# Patient Record
Sex: Female | Born: 1980 | Race: Black or African American | Hispanic: No | Marital: Married | Smoking: Never smoker
Health system: Southern US, Community
[De-identification: ages and names within clinical notes are randomized; demographics above are authoritative.]

---

## 2020-04-01 ENCOUNTER — Emergency Department (HOSPITAL_COMMUNITY)
Admission: EM | Admit: 2020-04-01 | Discharge: 2020-04-01 | Disposition: A | Discharge status: Home / Self Care | Payer: No Typology Code available for payment source | Attending: Emergency Medicine | Admitting: Emergency Medicine

## 2020-04-01 ENCOUNTER — Encounter (HOSPITAL_COMMUNITY): Payer: Self-pay | Admitting: *Deleted

## 2020-04-01 ENCOUNTER — Other Ambulatory Visit: Payer: Self-pay

## 2020-04-01 DIAGNOSIS — M542 Cervicalgia: Secondary | ICD-10-CM | POA: Insufficient documentation

## 2020-04-01 DIAGNOSIS — Z5321 Procedure and treatment not carried out due to patient leaving prior to being seen by health care provider: Secondary | ICD-10-CM | POA: Insufficient documentation

## 2020-04-01 NOTE — ED Notes (Signed)
Call pt no answer.
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
## 2020-09-16 ENCOUNTER — Other Ambulatory Visit: Payer: Self-pay | Admitting: Pain Medicine

## 2020-09-16 DIAGNOSIS — M5412 Radiculopathy, cervical region: Secondary | ICD-10-CM

## 2020-10-03 ENCOUNTER — Ambulatory Visit
Admission: RE | Admit: 2020-10-03 | Discharge: 2020-10-03 | Disposition: A | Discharge status: Home / Self Care | Payer: No Typology Code available for payment source | Source: Ambulatory Visit | Attending: Pain Medicine | Admitting: Pain Medicine

## 2020-10-03 ENCOUNTER — Other Ambulatory Visit: Payer: Self-pay

## 2020-10-03 DIAGNOSIS — M5412 Radiculopathy, cervical region: Secondary | ICD-10-CM

## 2021-02-01 ENCOUNTER — Other Ambulatory Visit: Payer: Self-pay | Admitting: Gastroenterology

## 2021-02-01 DIAGNOSIS — R1084 Generalized abdominal pain: Secondary | ICD-10-CM

## 2021-02-01 DIAGNOSIS — R1030 Lower abdominal pain, unspecified: Secondary | ICD-10-CM

## 2021-02-15 ENCOUNTER — Other Ambulatory Visit: Payer: Self-pay

## 2022-01-10 ENCOUNTER — Other Ambulatory Visit: Payer: Self-pay | Admitting: Physician Assistant

## 2022-01-10 DIAGNOSIS — Z1231 Encounter for screening mammogram for malignant neoplasm of breast: Secondary | ICD-10-CM

## 2022-01-26 ENCOUNTER — Ambulatory Visit
Admission: RE | Admit: 2022-01-26 | Discharge: 2022-01-26 | Disposition: A | Discharge status: Home / Self Care | Payer: PRIVATE HEALTH INSURANCE | Source: Ambulatory Visit | Attending: Physician Assistant | Admitting: Physician Assistant

## 2022-01-26 DIAGNOSIS — Z1231 Encounter for screening mammogram for malignant neoplasm of breast: Secondary | ICD-10-CM

## 2022-01-27 ENCOUNTER — Other Ambulatory Visit: Payer: Self-pay | Admitting: Physician Assistant

## 2022-01-27 DIAGNOSIS — R928 Other abnormal and inconclusive findings on diagnostic imaging of breast: Secondary | ICD-10-CM

## 2022-02-08 ENCOUNTER — Ambulatory Visit
Admission: RE | Admit: 2022-02-08 | Discharge: 2022-02-08 | Disposition: A | Discharge status: Home / Self Care | Payer: PRIVATE HEALTH INSURANCE | Source: Ambulatory Visit | Attending: Physician Assistant | Admitting: Physician Assistant

## 2022-02-08 ENCOUNTER — Other Ambulatory Visit: Payer: Self-pay | Admitting: Physician Assistant

## 2022-02-08 DIAGNOSIS — R928 Other abnormal and inconclusive findings on diagnostic imaging of breast: Secondary | ICD-10-CM

## 2022-02-08 DIAGNOSIS — N6310 Unspecified lump in the right breast, unspecified quadrant: Secondary | ICD-10-CM

## 2022-02-17 ENCOUNTER — Ambulatory Visit
Admission: RE | Admit: 2022-02-17 | Discharge: 2022-02-17 | Disposition: A | Discharge status: Home / Self Care | Payer: PRIVATE HEALTH INSURANCE | Source: Ambulatory Visit | Attending: Physician Assistant | Admitting: Physician Assistant

## 2022-02-17 ENCOUNTER — Other Ambulatory Visit (HOSPITAL_COMMUNITY): Payer: Self-pay | Admitting: Diagnostic Radiology

## 2022-12-06 ENCOUNTER — Other Ambulatory Visit: Payer: Self-pay | Admitting: Physician Assistant

## 2022-12-06 DIAGNOSIS — Z1231 Encounter for screening mammogram for malignant neoplasm of breast: Secondary | ICD-10-CM

## 2023-03-03 IMAGING — MG MM BREAST LOCALIZATION CLIP
4 series · 4 of 12 positions shown · non-contrast
Comparison: Previous exam(s).

CLINICAL DATA: Status post ultrasound-guided core needle biopsy of
a 1.2 cm mass in the 1 o'clock position of the right breast.

EXAM:
3D DIAGNOSTIC RIGHT MAMMOGRAM POST ULTRASOUND BIOPSY

[R CC synth-2D]
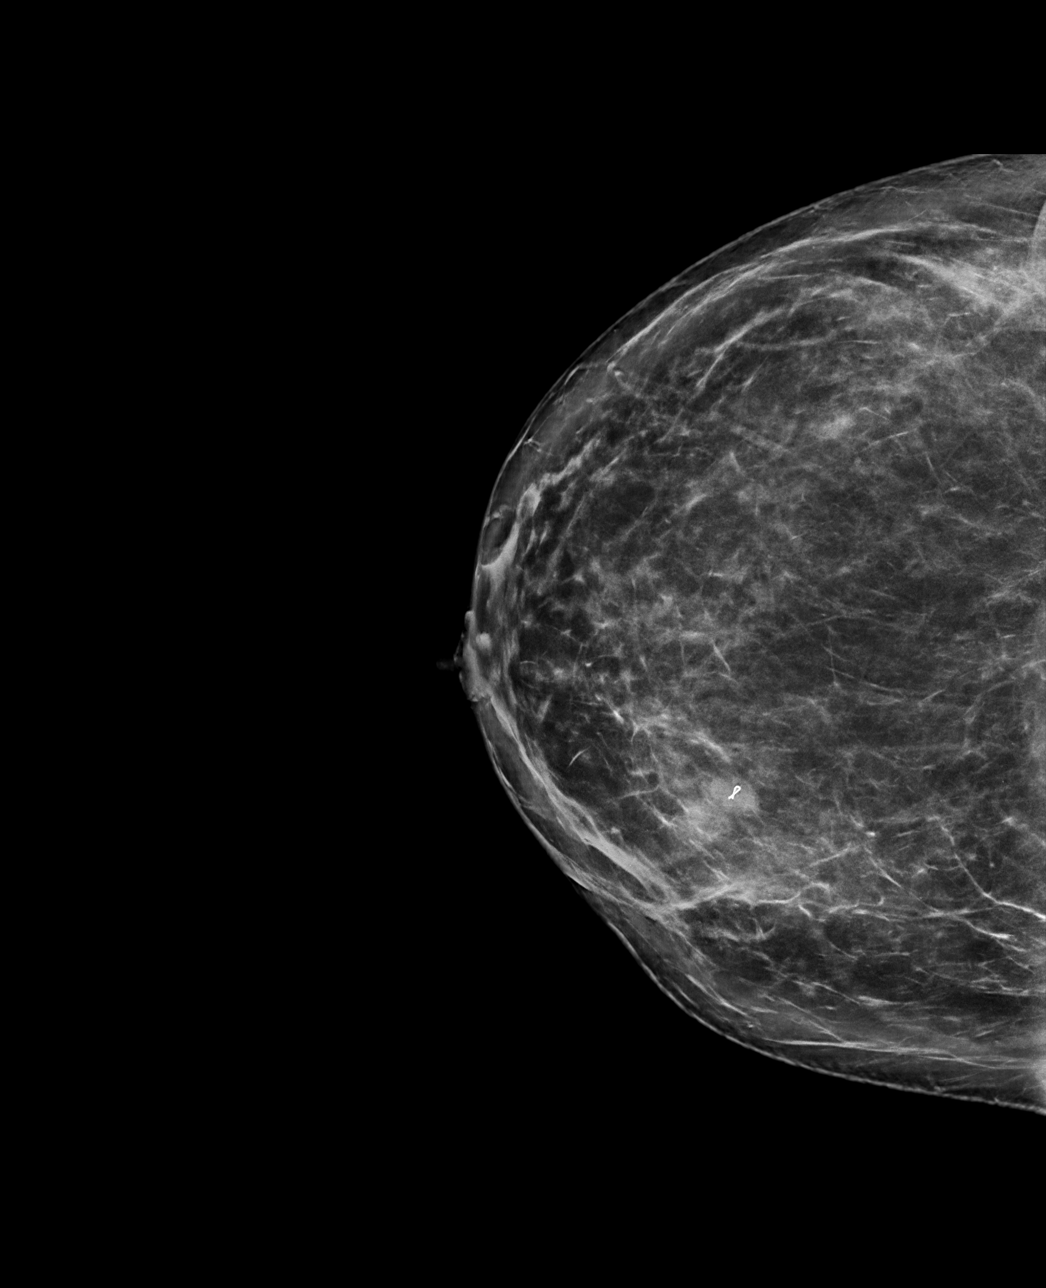

[R ML synth-2D]
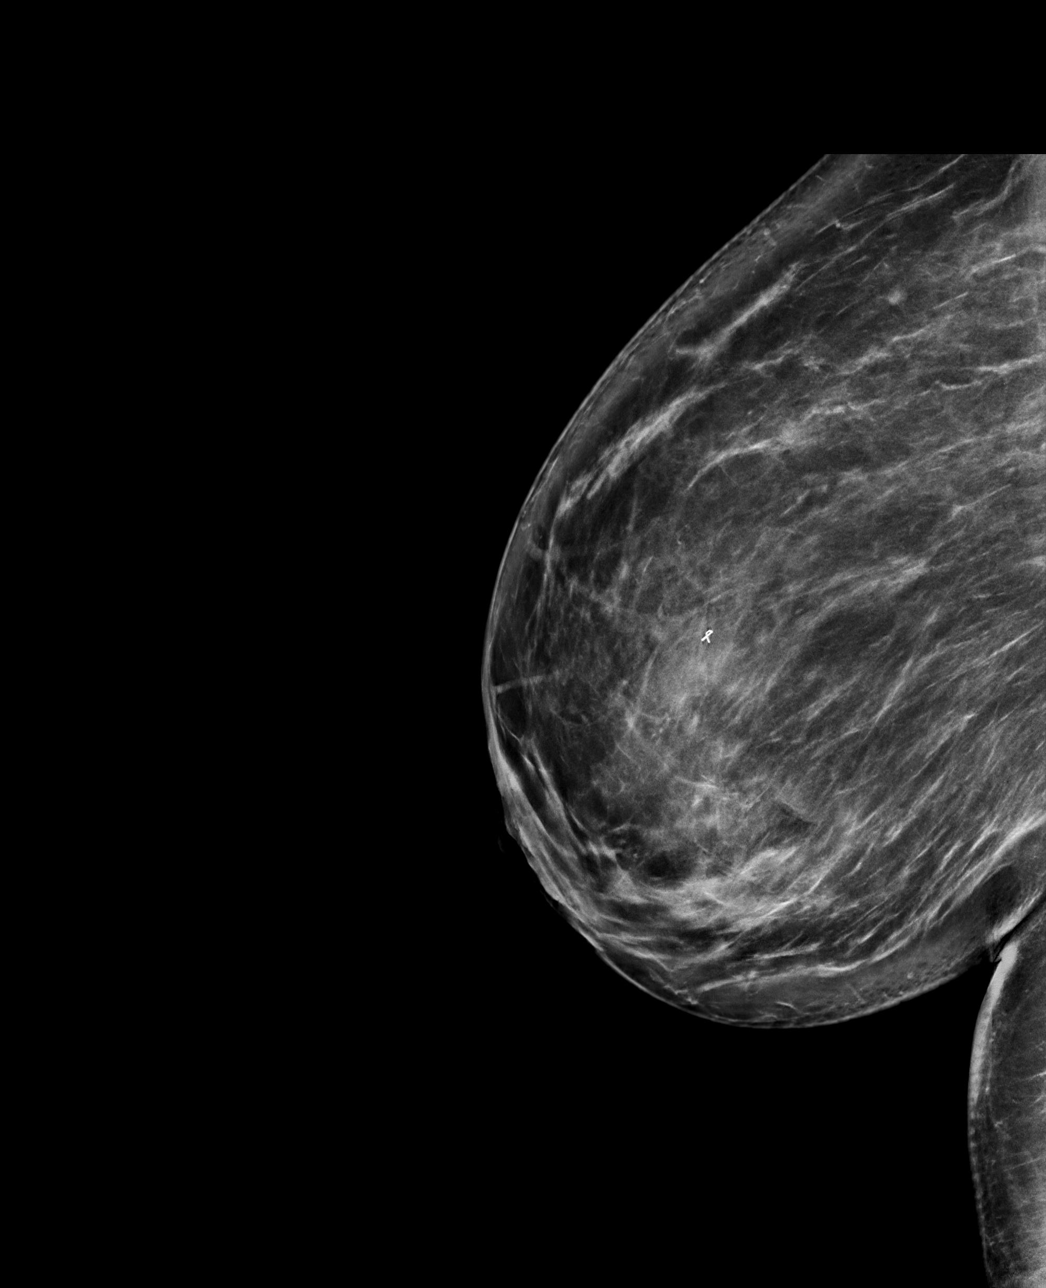

[R CC tomo · tomo slice 52/103.0]
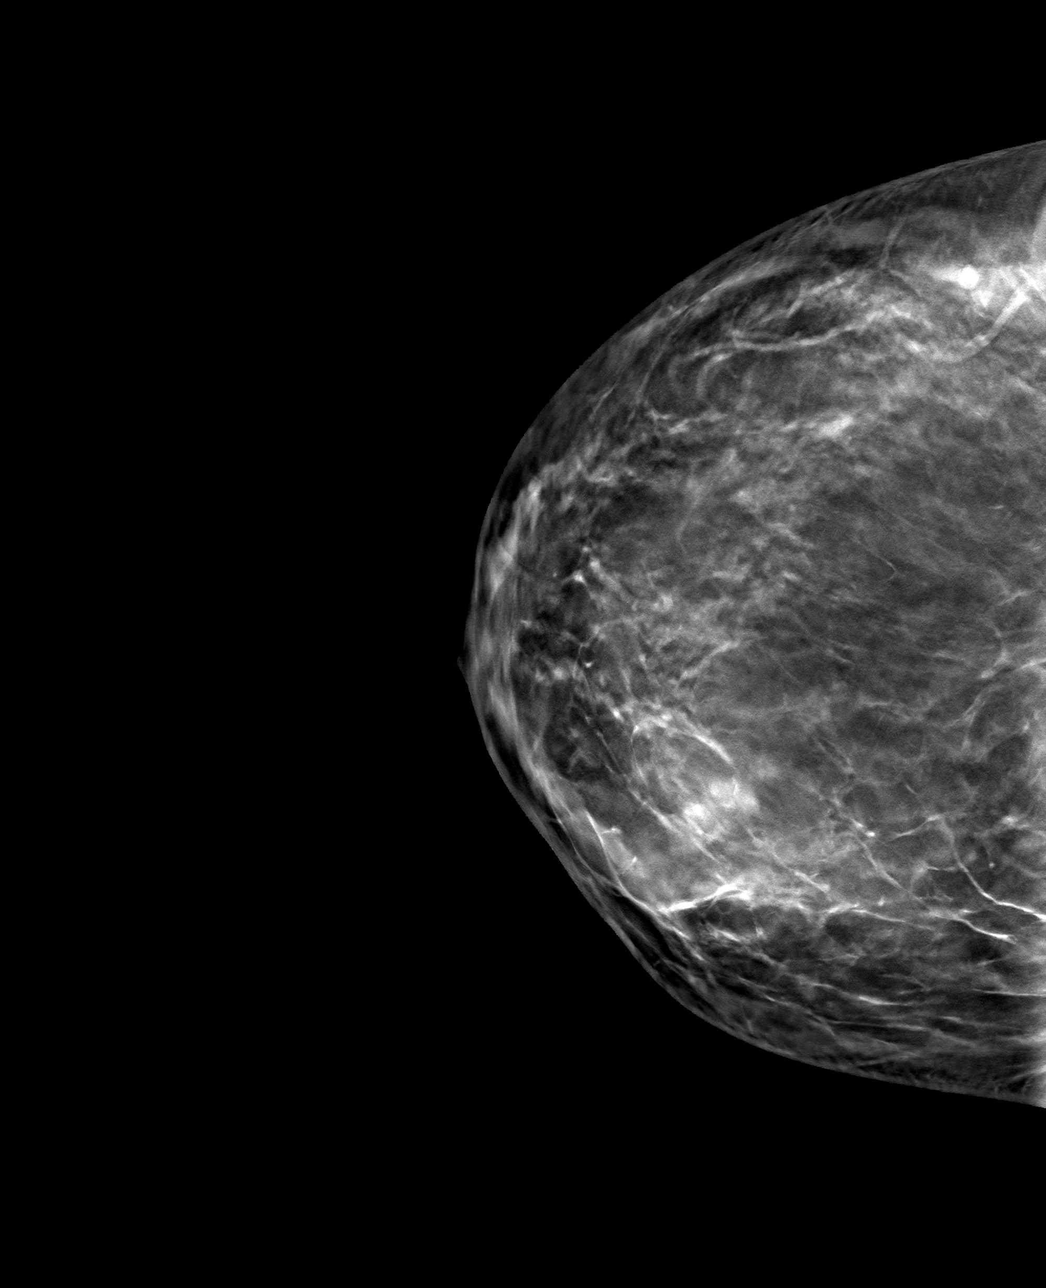

[R ML tomo · tomo slice 55/108.0]
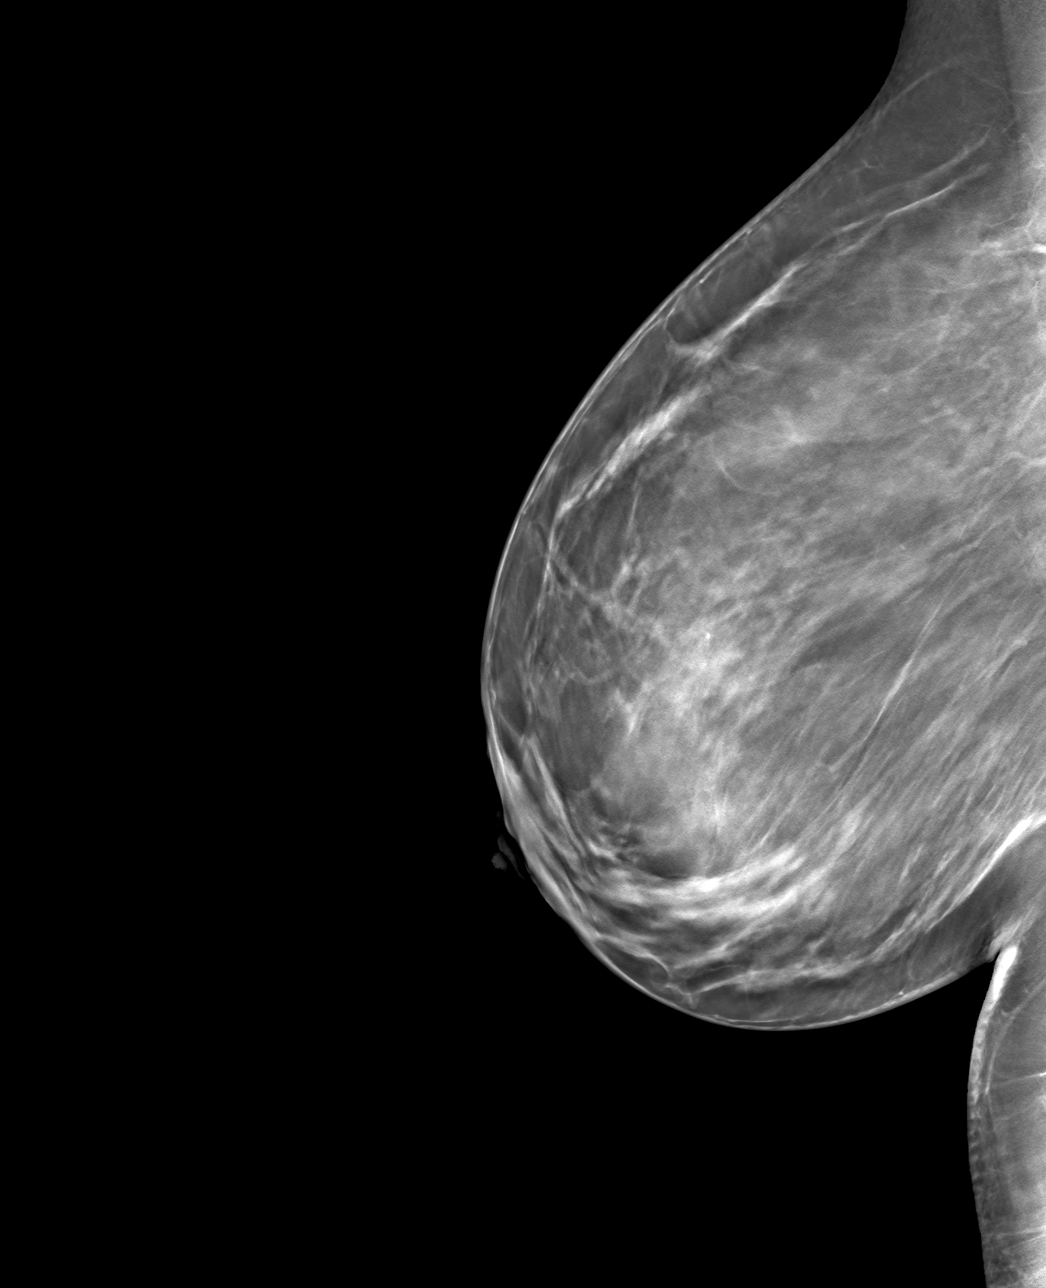

[4 of 12 positions shown; findings below may reference images not displayed]

FINDINGS: 3D Mammographic images were obtained following ultrasound guided
biopsy of a 1.2 cm mass in the 1 o'clock position of the right
breast. The biopsy marking clip is in expected position at the site
of biopsy.
IMPRESSION: Appropriate positioning of the ribbon shaped biopsy marking clip in
the biopsied mass in the 1 o'clock position of the right breast.

Final Assessment: Post Procedure Mammograms for Marker Placement

## 2024-03-29 ENCOUNTER — Other Ambulatory Visit: Payer: Self-pay | Admitting: Physician Assistant

## 2024-03-29 DIAGNOSIS — Z1231 Encounter for screening mammogram for malignant neoplasm of breast: Secondary | ICD-10-CM

## 2024-04-23 ENCOUNTER — Ambulatory Visit: Payer: PRIVATE HEALTH INSURANCE

## 2024-08-12 ENCOUNTER — Other Ambulatory Visit: Payer: Self-pay | Admitting: Family Medicine

## 2024-08-12 ENCOUNTER — Ambulatory Visit
Admission: RE | Admit: 2024-08-12 | Discharge: 2024-08-12 | Disposition: A | Discharge status: Home / Self Care | Payer: PRIVATE HEALTH INSURANCE | Source: Ambulatory Visit | Attending: Physician Assistant | Admitting: Physician Assistant

## 2024-08-12 DIAGNOSIS — Z1231 Encounter for screening mammogram for malignant neoplasm of breast: Secondary | ICD-10-CM

## 2024-08-13 ENCOUNTER — Other Ambulatory Visit: Payer: Self-pay | Admitting: Family Medicine

## 2024-08-13 DIAGNOSIS — R928 Other abnormal and inconclusive findings on diagnostic imaging of breast: Secondary | ICD-10-CM

## 2024-08-22 ENCOUNTER — Encounter

## 2024-08-22 ENCOUNTER — Other Ambulatory Visit
